# Patient Record
Sex: Male | Born: 1950 | Race: Black or African American | Hispanic: No | Marital: Single | Smoking: Current some day smoker
Health system: Southern US, Community
[De-identification: ages and names within clinical notes are randomized; demographics above are authoritative.]

## PROBLEM LIST (undated history)

## (undated) DIAGNOSIS — I10 Essential (primary) hypertension: Secondary | ICD-10-CM

## (undated) HISTORY — PX: HERNIA REPAIR: SHX51

## (undated) HISTORY — DX: Essential (primary) hypertension: I10

## (undated) HISTORY — PX: COLONOSCOPY: SHX174

---

## 2020-01-29 ENCOUNTER — Encounter: Payer: Self-pay | Admitting: Gastroenterology

## 2020-03-21 ENCOUNTER — Other Ambulatory Visit: Payer: Self-pay

## 2020-03-21 ENCOUNTER — Ambulatory Visit (AMBULATORY_SURGERY_CENTER): Payer: Self-pay | Admitting: *Deleted

## 2020-03-21 VITALS — Ht 73.0 in | Wt 159.0 lb

## 2020-03-21 DIAGNOSIS — Z01818 Encounter for other preprocedural examination: Secondary | ICD-10-CM

## 2020-03-21 DIAGNOSIS — Z1211 Encounter for screening for malignant neoplasm of colon: Secondary | ICD-10-CM

## 2020-03-21 MED ORDER — PEG-KCL-NACL-NASULF-NA ASC-C 100 G PO SOLR: 1.0000 | Freq: Once | ORAL | 0 refills | Status: AC

## 2020-03-21 NOTE — Progress Notes (Signed)
Patient is here in-person for PV. Patient denies any allergies to eggs or soy. Patient denies any problems with anesthesia/sedation. Patient denies any oxygen use at home. Patient denies taking any diet/weight loss medications or blood thinners. Patient is not being treated for MRSA or C-diff. Patient is aware of our care-partner policy and [id number] safety protocol.  COVID-19 screening test is on 9/9, the pt is aware.   Movi Prep Prescription printed and given to pt.

## 2020-03-31 ENCOUNTER — Ambulatory Visit (INDEPENDENT_AMBULATORY_CARE_PROVIDER_SITE_OTHER): Payer: Non-veteran care

## 2020-03-31 ENCOUNTER — Other Ambulatory Visit: Payer: Self-pay | Admitting: Gastroenterology

## 2020-03-31 DIAGNOSIS — Z1159 Encounter for screening for other viral diseases: Secondary | ICD-10-CM

## 2020-03-31 LAB — SARS CORONAVIRUS 2 (TAT 6-24 HRS): SARS Coronavirus 2: NEGATIVE

## 2020-04-04 ENCOUNTER — Ambulatory Visit (AMBULATORY_SURGERY_CENTER): Payer: No Typology Code available for payment source | Admitting: Gastroenterology

## 2020-04-04 ENCOUNTER — Other Ambulatory Visit: Payer: Self-pay

## 2020-04-04 ENCOUNTER — Encounter: Payer: Self-pay | Admitting: Gastroenterology

## 2020-04-04 ENCOUNTER — Other Ambulatory Visit: Payer: Self-pay | Admitting: Gastroenterology

## 2020-04-04 VITALS — BP 90/64 | HR 63 | Temp 97.5°F | Resp 13 | Ht 73.0 in | Wt 159.0 lb

## 2020-04-04 DIAGNOSIS — K635 Polyp of colon: Secondary | ICD-10-CM | POA: Diagnosis not present

## 2020-04-04 DIAGNOSIS — Z1211 Encounter for screening for malignant neoplasm of colon: Secondary | ICD-10-CM

## 2020-04-04 DIAGNOSIS — D123 Benign neoplasm of transverse colon: Secondary | ICD-10-CM

## 2020-04-04 MED ORDER — SODIUM CHLORIDE 0.9 % IV SOLN: 500.0000 mL | Freq: Once | INTRAVENOUS | Status: DC

## 2020-04-04 NOTE — Progress Notes (Signed)
Pt's states no medical or surgical changes since previsit or office visit.  SB - vitals 

## 2020-04-04 NOTE — Op Note (Signed)
Jonathon Young: Jonathon Young Procedure Date: 04/04/2020 11:03 AM MRN: [id number] Endoscopist: Foyil. Loletha Carrow , MD Age: 69 Referring MD:  Date of Birth: [birth year] Gender: Male Account #: [id number] Procedure:                Colonoscopy Indications:              Screening for colorectal malignant neoplasm                            (reportedly no polyps on colonoscopy at [facility] 10                            years ago) Medicines:                Monitored Anesthesia Care Procedure:                Pre-Anesthesia Assessment:                           - Prior to the procedure, a History and Physical                            was performed, and patient medications and                            allergies were reviewed. The patient's tolerance of                            previous anesthesia was also reviewed. The risks                            and benefits of the procedure and the sedation                            options and risks were discussed with the patient.                            All questions were answered, and informed consent                            was obtained. Prior Anticoagulants: The patient has                            taken no previous anticoagulant or antiplatelet                            agents. ASA Grade Assessment: II - A patient with                            mild systemic disease. After reviewing the risks                            and benefits, the patient was deemed in  satisfactory condition to undergo the procedure.                           After obtaining informed consent, the colonoscope                            was passed under direct vision. Throughout the                            procedure, the patient's blood pressure, pulse, and                            oxygen saturations were monitored continuously. The                            Colonoscope was introduced through the anus and                             advanced to the the cecum, identified by                            appendiceal orifice and ileocecal valve. The                            colonoscopy was somewhat difficult due to multiple                            diverticula in the colon. The patient tolerated the                            procedure well. The quality of the bowel                            preparation was fair. The ileocecal valve,                            appendiceal orifice, and rectum were photographed.                            The bowel preparation used was MoviPrep. Scope In: 11:32:15 AM Scope Out: 11:50:27 AM Scope Withdrawal Time: 0 hours 13 minutes 13 seconds  Total Procedure Duration: 0 hours 18 minutes 12 seconds  Findings:                 The perianal and digital rectal examinations were                            normal.                           Many diverticula were found in the entire colon.                           A 5 mm polyp was found in the transverse colon. The  polyp was sessile. The polyp was removed with a                            cold snare. Resection and retrieval were complete.                           Retroflexion in the rectum was not performed due to                            narrow anatomy.                           The exam was otherwise without abnormality. Complications:            No immediate complications. Estimated Blood Loss:     Estimated blood loss was minimal. Impression:               - Preparation of the colon was fair.                           - Diverticulosis in the entire examined colon.                           - One 5 mm polyp in the transverse colon, removed                            with a cold snare. Resected and retrieved.                           - The examination was otherwise normal. Recommendation:           - Patient has a contact number available for                            emergencies. The signs  and symptoms of potential                            delayed complications were discussed with the                            patient. Return to normal activities tomorrow.                            Written discharge instructions were provided to the                            patient.                           - Resume previous diet.                           - Continue present medications.                           - Await pathology results.                           -  Repeat colonoscopy in 3 years for surveillance. 4                            LITER PEG PREP FOR NEXT EXAM Jonathon Young L. Loletha Carrow, MD 04/04/2020 11:55:43 AM This report has been signed electronically.

## 2020-04-04 NOTE — Progress Notes (Signed)
Called to room to assist during endoscopic procedure.  Patient ID and intended procedure confirmed with present staff. Received instructions for my participation in the procedure from the performing physician.  

## 2020-04-04 NOTE — Patient Instructions (Signed)
Handout on polyps given to you today  Await pathology results   YOU HAD AN ENDOSCOPIC PROCEDURE TODAY AT THE [facility]:   Refer to the procedure report that was given to you for any specific questions about what was found during the examination.  If the procedure report does not answer your questions, please call your gastroenterologist to clarify.  If you requested that your care partner not be given the details of your procedure findings, then the procedure report has been included in a sealed envelope for you to review at your convenience later.  YOU SHOULD EXPECT: Some feelings of bloating in the abdomen. Passage of more gas than usual.  Walking can help get rid of the air that was put into your GI tract during the procedure and reduce the bloating. If you had a lower endoscopy (such as a colonoscopy or flexible sigmoidoscopy) you may notice spotting of blood in your stool or on the toilet paper. If you underwent a bowel prep for your procedure, you may not have a normal bowel movement for a few days.  Please Note:  You might notice some irritation and congestion in your nose or some drainage.  This is from the oxygen used during your procedure.  There is no need for concern and it should clear up in a day or so.  SYMPTOMS TO REPORT IMMEDIATELY:   Following lower endoscopy (colonoscopy or flexible sigmoidoscopy):  Excessive amounts of blood in the stool  Significant tenderness or worsening of abdominal pains  Swelling of the abdomen that is new, acute  Fever of 100F or higher  For urgent or emergent issues, a gastroenterologist can be reached at any hour by calling [phone]. Do not use MyChart messaging for urgent concerns.    DIET:  We do recommend a small meal at first, but then you may proceed to your regular diet.  Drink plenty of fluids but you should avoid alcoholic beverages for 24 hours.  ACTIVITY:  You should plan to take it easy for the rest of today and  you should NOT DRIVE or use heavy machinery until tomorrow (because of the sedation medicines used during the test).    FOLLOW UP: Our staff will call the number listed on your records 48-72 hours following your procedure to check on you and address any questions or concerns that you may have regarding the information given to you following your procedure. If we do not reach you, we will leave a message.  We will attempt to reach you two times.  During this call, we will ask if you have developed any symptoms of COVID 19. If you develop any symptoms (ie: fever, flu-like symptoms, shortness of breath, cough etc.) before then, please call [phone].  If you test positive for Covid 19 in the 2 weeks post procedure, please call and report this information to us.    If any biopsies were taken you will be contacted by phone or by letter within the next 1-3 weeks.  Please call us at [phone] if you have not heard about the biopsies in 3 weeks.    SIGNATURES/CONFIDENTIALITY: You and/or your care partner have signed paperwork which will be entered into your electronic medical record.  These signatures attest to the fact that that the information above on your After Visit Summary has been reviewed and is understood.  Full responsibility of the confidentiality of this discharge information lies with you and/or your care-partner. 

## 2020-04-04 NOTE — Progress Notes (Signed)
PT taken to PACU. Monitors in place. VSS. Report given to RN. 

## 2020-04-06 ENCOUNTER — Telehealth: Payer: Self-pay | Admitting: *Deleted

## 2020-04-06 NOTE — Telephone Encounter (Signed)
  Follow up Call-  Call back number 04/04/2020  Post procedure Call Back phone  # [phone]  Permission to leave phone message Yes     Patient questions:  Do you have a fever, pain , or abdominal swelling? No. Pain Score  0 *  Have you tolerated food without any problems? Yes.    Have you been able to return to your normal activities? Yes.    Do you have any questions about your discharge instructions: Diet   No. Medications  No. Follow up visit  No.  Do you have questions or concerns about your Care? No.  Actions: * If pain score is 4 or above: No action needed, pain <4.  1. Have you developed a fever since your procedure? no  2.   Have you had an respiratory symptoms (SOB or cough) since your procedure? no  3.   Have you tested positive for COVID 19 since your procedure no  4.   Have you had any family members/close contacts diagnosed with the COVID 19 since your procedure?  no   If yes to any of these questions please route to Joylene John, RN and Joella Prince, RN

## 2020-04-07 ENCOUNTER — Encounter: Payer: Self-pay | Admitting: Gastroenterology

## 2020-04-13 ENCOUNTER — Other Ambulatory Visit (HOSPITAL_COMMUNITY): Payer: Self-pay | Admitting: Urology

## 2020-04-13 ENCOUNTER — Other Ambulatory Visit: Payer: Self-pay | Admitting: Urology

## 2020-04-13 DIAGNOSIS — N492 Inflammatory disorders of scrotum: Secondary | ICD-10-CM

## 2020-04-13 DIAGNOSIS — D294 Benign neoplasm of scrotum: Secondary | ICD-10-CM

## 2020-04-21 ENCOUNTER — Other Ambulatory Visit: Payer: Self-pay

## 2020-04-21 ENCOUNTER — Ambulatory Visit (HOSPITAL_COMMUNITY)
Admission: RE | Admit: 2020-04-21 | Discharge: 2020-04-21 | Disposition: A | Discharge status: Home / Self Care | Payer: No Typology Code available for payment source | Source: Ambulatory Visit | Attending: Urology | Admitting: Urology

## 2020-04-21 DIAGNOSIS — N492 Inflammatory disorders of scrotum: Secondary | ICD-10-CM | POA: Insufficient documentation

## 2020-04-21 DIAGNOSIS — D294 Benign neoplasm of scrotum: Secondary | ICD-10-CM | POA: Diagnosis present

## 2020-04-21 MED ORDER — GADOBUTROL 1 MMOL/ML IV SOLN
7.0000 mL | Freq: Once | INTRAVENOUS | Status: AC | PRN
  Administered 2020-04-21: 7 mL via INTRAVENOUS

## 2020-05-23 ENCOUNTER — Other Ambulatory Visit: Payer: Self-pay

## 2020-05-23 ENCOUNTER — Emergency Department (HOSPITAL_COMMUNITY): Payer: No Typology Code available for payment source

## 2020-05-23 ENCOUNTER — Encounter (HOSPITAL_COMMUNITY): Payer: Self-pay

## 2020-05-23 ENCOUNTER — Emergency Department (HOSPITAL_COMMUNITY)
Admission: EM | Admit: 2020-05-23 | Discharge: 2020-05-23 | Disposition: A | Discharge status: Home / Self Care | Payer: No Typology Code available for payment source | Attending: Emergency Medicine | Admitting: Emergency Medicine

## 2020-05-23 DIAGNOSIS — R12 Heartburn: Secondary | ICD-10-CM | POA: Insufficient documentation

## 2020-05-23 DIAGNOSIS — I10 Essential (primary) hypertension: Secondary | ICD-10-CM | POA: Diagnosis not present

## 2020-05-23 DIAGNOSIS — R066 Hiccough: Secondary | ICD-10-CM | POA: Insufficient documentation

## 2020-05-23 DIAGNOSIS — F17210 Nicotine dependence, cigarettes, uncomplicated: Secondary | ICD-10-CM | POA: Insufficient documentation

## 2020-05-23 DIAGNOSIS — Z79899 Other long term (current) drug therapy: Secondary | ICD-10-CM | POA: Diagnosis not present

## 2020-05-23 DIAGNOSIS — K2960 Other gastritis without bleeding: Secondary | ICD-10-CM

## 2020-05-23 LAB — CBC WITH DIFFERENTIAL/PLATELET
Abs Immature Granulocytes: 0.01 10*3/uL (ref 0.00–0.07)
Basophils Absolute: 0 10*3/uL (ref 0.0–0.1)
Basophils Relative: 1 %
Eosinophils Absolute: 0.2 10*3/uL (ref 0.0–0.5)
Eosinophils Relative: 3 %
HCT: 36.9 % — ABNORMAL LOW (ref 39.0–52.0)
Hemoglobin: 12.2 g/dL — ABNORMAL LOW (ref 13.0–17.0)
Immature Granulocytes: 0 %
Lymphocytes Relative: 28 %
Lymphs Abs: 1.4 10*3/uL (ref 0.7–4.0)
MCH: 30.6 pg (ref 26.0–34.0)
MCHC: 33.1 g/dL (ref 30.0–36.0)
MCV: 92.5 fL (ref 80.0–100.0)
Monocytes Absolute: 0.4 10*3/uL (ref 0.1–1.0)
Monocytes Relative: 8 %
Neutro Abs: 3 10*3/uL (ref 1.7–7.7)
Neutrophils Relative %: 60 %
Platelets: 255 10*3/uL (ref 150–400)
RBC: 3.99 MIL/uL — ABNORMAL LOW (ref 4.22–5.81)
RDW: 13.4 % (ref 11.5–15.5)
WBC: 4.9 10*3/uL (ref 4.0–10.5)
nRBC: 0 % (ref 0.0–0.2)

## 2020-05-23 LAB — URINALYSIS, ROUTINE W REFLEX MICROSCOPIC
Bilirubin Urine: NEGATIVE
Glucose, UA: NEGATIVE mg/dL
Hgb urine dipstick: NEGATIVE
Ketones, ur: NEGATIVE mg/dL
Leukocytes,Ua: NEGATIVE
Nitrite: NEGATIVE
Protein, ur: NEGATIVE mg/dL
Specific Gravity, Urine: 1.008 (ref 1.005–1.030)
pH: 9 — ABNORMAL HIGH (ref 5.0–8.0)

## 2020-05-23 LAB — COMPREHENSIVE METABOLIC PANEL
ALT: 8 U/L (ref 0–44)
AST: 25 U/L (ref 15–41)
Albumin: 4.4 g/dL (ref 3.5–5.0)
Alkaline Phosphatase: 62 U/L (ref 38–126)
Anion gap: 15 (ref 5–15)
BUN: 19 mg/dL (ref 8–23)
CO2: 27 mmol/L (ref 22–32)
Calcium: 10 mg/dL (ref 8.9–10.3)
Chloride: 93 mmol/L — ABNORMAL LOW (ref 98–111)
Creatinine, Ser: 1.47 mg/dL — ABNORMAL HIGH (ref 0.61–1.24)
GFR, Estimated: 52 mL/min — ABNORMAL LOW (ref >60)
Glucose, Bld: 94 mg/dL (ref 70–99)
Potassium: 4 mmol/L (ref 3.5–5.1)
Sodium: 135 mmol/L (ref 135–145)
Total Bilirubin: 0.9 mg/dL (ref 0.3–1.2)
Total Protein: 8.4 g/dL — ABNORMAL HIGH (ref 6.5–8.1)

## 2020-05-23 LAB — TROPONIN I (HIGH SENSITIVITY): Troponin I (High Sensitivity): 10 ng/L (ref <18)

## 2020-05-23 MED ORDER — FAMOTIDINE IN NACL 20-0.9 MG/50ML-% IV SOLN
20.0000 mg | Freq: Once | INTRAVENOUS | Status: AC
  Administered 2020-05-23: 20 mg via INTRAVENOUS
  Filled 2020-05-23: qty 50

## 2020-05-23 MED ORDER — SODIUM CHLORIDE 0.9 % IV BOLUS
1000.0000 mL | Freq: Once | INTRAVENOUS | Status: AC
  Administered 2020-05-23: 1000 mL via INTRAVENOUS

## 2020-05-23 MED ORDER — ALUM & MAG HYDROXIDE-SIMETH 200-200-20 MG/5ML PO SUSP
30.0000 mL | Freq: Once | ORAL | Status: AC
  Administered 2020-05-23: 30 mL via ORAL
  Filled 2020-05-23: qty 30

## 2020-05-23 MED ORDER — DIPHENHYDRAMINE HCL 50 MG/ML IJ SOLN
25.0000 mg | Freq: Once | INTRAMUSCULAR | Status: AC
  Administered 2020-05-23: 25 mg via INTRAVENOUS
  Filled 2020-05-23: qty 1

## 2020-05-23 MED ORDER — ESOMEPRAZOLE MAGNESIUM 40 MG PO CPDR: 40.0000 mg | DELAYED_RELEASE_CAPSULE | Freq: Every day | ORAL | 0 refills | Status: AC

## 2020-05-23 MED ORDER — PROCHLORPERAZINE EDISYLATE 10 MG/2ML IJ SOLN
10.0000 mg | Freq: Once | INTRAMUSCULAR | Status: AC
  Administered 2020-05-23: 10 mg via INTRAVENOUS
  Filled 2020-05-23: qty 2

## 2020-05-23 MED ORDER — LIDOCAINE VISCOUS HCL 2 % MT SOLN
15.0000 mL | Freq: Once | OROMUCOSAL | Status: AC
  Administered 2020-05-23: 15 mL via ORAL
  Filled 2020-05-23: qty 15

## 2020-05-23 NOTE — ED Provider Notes (Signed)
[facility] DEPT Provider Note   CSN: [id number] Arrival date & time: 05/23/20  1736     History Chief Complaint  Patient presents with  . Hiccups  . Heartburn  . Urinary Frequency    Jonathon Young is a 69 y.o. male history of hypertension here presenting with hiccups and heartburn.  Patient states that been having burning sensation in the middle of his chest since yesterday.  He also has occasional hiccups this morning.  He states that he is not on any antacid or any PPIs at home.  Patient denies any chest pain or shortness of breath.  Patient has urinary frequency as well but denies any dysuria or abdominal pain or vomiting.  The history is provided by the patient.       Past Medical History:  Diagnosis Date  . Hypertension     There are no problems to display for this patient.   Past Surgical History:  Procedure Laterality Date  . COLONOSCOPY  [facility]    [facility]  . HERNIA REPAIR         Family History  Problem Relation Age of Onset  . Colon cancer Neg Hx   . Colon polyps Neg Hx   . Esophageal cancer Neg Hx   . Rectal cancer Neg Hx   . Stomach cancer Neg Hx     Social History   Tobacco Use  . Smoking status: Current Some Day Smoker    Packs/day: 0.25    Types: Cigarettes  . Smokeless tobacco: Never Used  Vaping Use  . Vaping Use: Never used  Substance Use Topics  . Alcohol use: Yes    Alcohol/week: 12.0 standard drinks    Types: 12 Cans of beer per week  . Drug use: Not Currently    Home Medications Prior to Admission medications   Medication Sig Start Date End Date Taking? Authorizing Provider  AMLODIPINE BENZOATE PO Take 5 mg by mouth daily.    [provider]  lisinopril-hydrochlorothiazide (ZESTORETIC) 20-12.5 MG tablet Take 1 tablet by mouth daily.    [provider]  sildenafil (VIAGRA) 100 MG tablet Take 100 mg by mouth daily as needed for erectile dysfunction.     [provider]  traMADol (ULTRAM) 50 MG tablet Take by mouth every 6 (six) hours as needed.    [provider]    Allergies    Patient has no known allergies.  Review of Systems   Review of Systems  Gastrointestinal: Positive for heartburn.  Genitourinary: Positive for frequency.  All other systems reviewed and are negative.   Physical Exam Updated Vital Signs BP (!) 150/89   Pulse 66   Temp 98.5 F (36.9 C) (Oral)   Resp 14   Ht 6\' 1"  (1.854 m)   Wt 73.5 kg   SpO2 100%   BMI 21.37 kg/m   Physical Exam Vitals and nursing note reviewed.  Constitutional:      Comments: Well appearing, occasional hiccups   HENT:     Head: Normocephalic.     Right Ear: Tympanic membrane normal.     Left Ear: Tympanic membrane normal.     Nose: Nose normal.     Mouth/Throat:     Mouth: Mucous membranes are dry.  Eyes:     Extraocular Movements: Extraocular movements intact.     Pupils: Pupils are equal, round, and reactive to light.  Cardiovascular:     Rate and Rhythm: Normal  rate and regular rhythm.     Pulses: Normal pulses.     Heart sounds: Normal heart sounds.  Pulmonary:     Effort: Pulmonary effort is normal.     Breath sounds: Normal breath sounds.  Abdominal:     General: Abdomen is flat.     Palpations: Abdomen is soft.  Musculoskeletal:        General: Normal range of motion.     Cervical back: Normal range of motion.  Skin:    General: Skin is warm.     Capillary Refill: Capillary refill takes less than 2 seconds.  Neurological:     General: No focal deficit present.     Mental Status: He is alert and oriented to person, place, and time.  Psychiatric:        Mood and Affect: Mood normal.        Behavior: Behavior normal.     ED Results / Procedures / Treatments   Labs (all labs ordered are listed, but only abnormal results are displayed) Labs Reviewed  URINALYSIS, ROUTINE W REFLEX MICROSCOPIC - Abnormal; Notable for the following  components:      Result Value   pH 9.0 (*)    All other components within normal limits  CBC WITH DIFFERENTIAL/PLATELET - Abnormal; Notable for the following components:   RBC 3.99 (*)    Hemoglobin 12.2 (*)    HCT 36.9 (*)    All other components within normal limits  COMPREHENSIVE METABOLIC PANEL - Abnormal; Notable for the following components:   Chloride 93 (*)    Creatinine, Ser 1.47 (*)    Total Protein 8.4 (*)    GFR, Estimated 52 (*)    All other components within normal limits  TROPONIN I (HIGH SENSITIVITY)  TROPONIN I (HIGH SENSITIVITY)    EKG None   ED ECG REPORT   Date: 05/23/2020  Rate: 69  Rhythm: normal sinus rhythm  QRS Axis: normal  Intervals: normal  ST/T Wave abnormalities: normal  Conduction Disutrbances:none  Narrative Interpretation:   Old EKG Reviewed: none available  I have personally reviewed the EKG tracing and agree with the computerized printout as noted.   Radiology DG Chest Port 1 View  Result Date: 05/23/2020 CLINICAL DATA:  Chest pain, heartburn, urinary frequency EXAM: PORTABLE CHEST 1 VIEW COMPARISON:  None. FINDINGS: Single frontal view of the chest demonstrates an unremarkable cardiac silhouette. No airspace disease, effusion, or pneumothorax. No acute bony abnormalities. IMPRESSION: 1. No acute intrathoracic process. Electronically Signed   By: Randa Ngo M.D.   On: 05/23/2020 19:57    Procedures Procedures (including critical care time)  Medications Ordered in ED Medications  sodium chloride 0.9 % bolus 1,000 mL (1,000 mLs Intravenous New Bag/Given (Non-Interop) 05/23/20 2012)  prochlorperazine (COMPAZINE) injection 10 mg (10 mg Intravenous Given 05/23/20 2012)  diphenhydrAMINE (BENADRYL) injection 25 mg (25 mg Intravenous Given 05/23/20 2010)  famotidine (PEPCID) IVPB 20 mg premix (0 mg Intravenous Stopped 05/23/20 2048)  alum & mag hydroxide-simeth (MAALOX/MYLANTA) 200-200-20 MG/5ML suspension 30 mL (30 mLs Oral Given 05/23/20  1956)    And  lidocaine (XYLOCAINE) 2 % viscous mouth solution 15 mL (15 mLs Oral Given 05/23/20 1957)    ED Course  I have reviewed the triage vital signs and the nursing notes.  Pertinent labs & imaging results that were available during my care of the patient were reviewed by me and considered in my medical decision making (see chart for details).    MDM Rules/Calculators/A&P  Jonathon Young is a 69 y.o. male here presenting with hiccups and heartburn.  I think likely reflux.  I have low suspicion for ACS.  Plan to get troponin x1 and chest x-ray and will give PPI and GI cocktails.  9:22 PM Patient's labs are unremarkable.  Urinalysis is unremarkable.  Troponin negative x1 and felt better after meds.  Will discharge home with Nexium and GI follow-up  Final Clinical Impression(s) / ED Diagnoses Final diagnoses:  None    Rx / DC Orders ED Discharge Orders    None       Drenda Freeze, MD 05/23/20 2123

## 2020-05-23 NOTE — Discharge Instructions (Signed)
You likely have gastritis causing your hiccups  Take Nexium daily.  Avoid any spicy food.  Follow-up with Dr. Loletha Carrow from GI   Return to ER if you have worse chest pain or acid reflux or hiccups.

## 2020-05-23 NOTE — ED Triage Notes (Addendum)
Patient c/o hiccups and heartburn since yesterday.  Patient reports urinary frequency since this AM.

## 2021-07-18 IMAGING — DX DG CHEST 1V PORT
1 series · 1 of 1 positions shown · non-contrast
Comparison: None.

CLINICAL DATA: Chest pain, heartburn, urinary frequency

EXAM:
PORTABLE CHEST 1 VIEW

[chest ap]
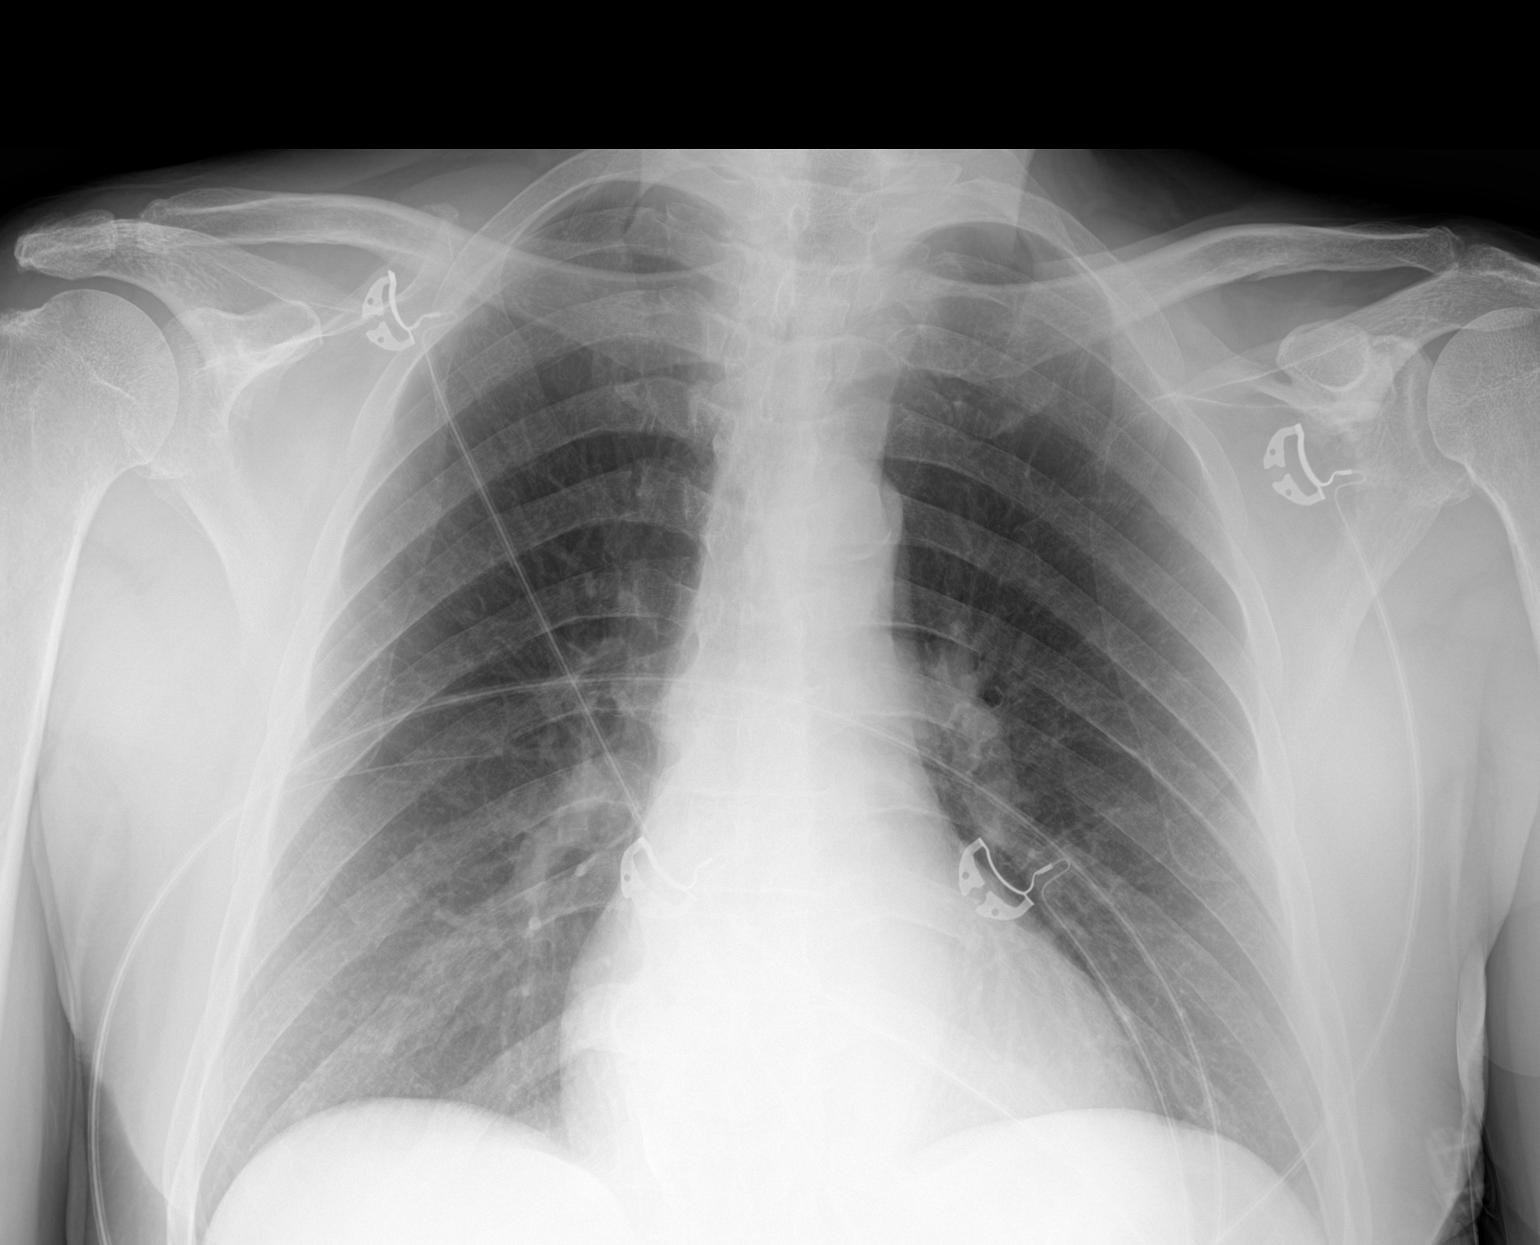

[1 of 1 positions shown; findings below may reference images not displayed]

FINDINGS: Single frontal view of the chest demonstrates an unremarkable
cardiac silhouette. No airspace disease, effusion, or pneumothorax.
No acute bony abnormalities.
IMPRESSION: 1. No acute intrathoracic process.

## 2023-01-31 ENCOUNTER — Encounter: Payer: Self-pay | Admitting: Gastroenterology
